# Patient Record
Sex: Female | Born: 1980
Health system: Southern US, Community
[De-identification: ages and names within clinical notes are randomized; demographics above are authoritative.]

## PROBLEM LIST (undated history)

## (undated) DIAGNOSIS — I82409 Acute embolism and thrombosis of unspecified deep veins of unspecified lower extremity: Secondary | ICD-10-CM

## (undated) DIAGNOSIS — J45909 Unspecified asthma, uncomplicated: Secondary | ICD-10-CM

## (undated) DIAGNOSIS — J302 Other seasonal allergic rhinitis: Secondary | ICD-10-CM

## (undated) DIAGNOSIS — E669 Obesity, unspecified: Secondary | ICD-10-CM

## (undated) HISTORY — DX: Other seasonal allergic rhinitis: J30.2

## (undated) HISTORY — PX: BARIATRIC SURGERY: SHX1103

## (undated) HISTORY — DX: Obesity, unspecified: E66.9

## (undated) HISTORY — DX: Unspecified asthma, uncomplicated: J45.909

## (undated) HISTORY — DX: Acute embolism and thrombosis of unspecified deep veins of unspecified lower extremity: I82.409

---

## 2019-08-21 ENCOUNTER — Ambulatory Visit
Admission: RE | Admit: 2019-08-21 | Discharge: 2019-08-21 | Disposition: A | Discharge status: Home / Self Care | Payer: BC Managed Care – PPO | Source: Ambulatory Visit | Attending: Emergency Medicine | Admitting: Emergency Medicine

## 2019-08-21 ENCOUNTER — Other Ambulatory Visit: Payer: Self-pay

## 2019-08-21 ENCOUNTER — Ambulatory Visit (INDEPENDENT_AMBULATORY_CARE_PROVIDER_SITE_OTHER): Payer: BC Managed Care – PPO

## 2019-08-21 VITALS — BP 123/89 | HR 71 | Temp 98.6°F | Resp 18 | Ht 62.0 in | Wt 192.0 lb

## 2019-08-21 DIAGNOSIS — S9032XA Contusion of left foot, initial encounter: Secondary | ICD-10-CM

## 2019-08-21 DIAGNOSIS — S60212A Contusion of left wrist, initial encounter: Secondary | ICD-10-CM | POA: Diagnosis not present

## 2019-08-21 DIAGNOSIS — W108XXA Fall (on) (from) other stairs and steps, initial encounter: Secondary | ICD-10-CM

## 2019-08-21 NOTE — ED Triage Notes (Signed)
Patient in today after falling on 08/19/19. Patient c/o neck pain, left thumb and wrist pain, left knee and left ankle pain. Patient states she was walking up the steps to her apartment and tripped and fell forward hitting her head on the wooden rail.  Patient is currently being treated for a blood clot behind her left knee and is on Xarelto.

## 2019-08-21 NOTE — ED Provider Notes (Signed)
[facility] - [place], [place]   Name: Lisa Gordon DOB: 03-14-1980 MRN: [id number] CSN: [id number] PCP: Patient, No Pcp Per  Arrival date and time:  08/21/19 1009  Chief Complaint:  Appointment, Fall (DOI 08/19/19), Neck Pain, Leg Pain, and Hand Pain   NOTE: Prior to seeing the patient today, I have reviewed the triage nursing documentation and vital signs. Clinical staff has updated patient's PMH/PSHx, current medication list, and drug allergies/intolerances to ensure comprehensive history available to assist in medical decision making.   History:   HPI: Lisa Gordon is a 39 y.o. female who presents today with complaints of pain to her left foot, knee and hand after a fall walking up the stairs 2 days ago.  Patient was moving from her apartment in flip-flops when she tripped going up the stairs.  She caught herself on her left side of her body, but she hit her head and knee.  No loss of consciousness with fall.  Since the fall, she has noticed some pain to her left foot, knee and hand.  She also has some residual pain to bilateral sides of her neck.  No headaches, abrasions, or any signs of blunt force trauma to her head noted.  She has been taking Tylenol for the pain, and occasionally icing the areas.  She is able to ambulate without assistance, but she does note some soreness to her foot with longer periods of ambulation and standing.  She is currently on Xarelto for blood clots to her posterior left leg.  Past Medical History:  Diagnosis Date  . Asthma    childhood  . DVT (deep venous thrombosis) (HCC)    left leg  . Obesity   . Seasonal allergies     Past Surgical History:  Procedure Laterality Date  . BARIATRIC SURGERY     vertical sleeve    Family History  Problem Relation Age of Onset  . Hypertension Mother   . Other Father        unknown medical history    Social History   Tobacco Use  . Smoking status: Never Smoker  . Smokeless tobacco: Never  Used  Vaping Use  . Vaping Use: Never used  Substance Use Topics  . Alcohol use: Yes    Comment: rare  . Drug use: Never    There are no problems to display for this patient.   Home Medications:    Current Meds  Medication Sig  . SAXENDA 18 MG/3ML SOPN Inject 3 mg into the skin daily.  Alveda Reasons 10 MG TABS tablet Take 10 mg by mouth daily.    Allergies:   Amoxicillin and Penicillins  Review of Systems (ROS): Review of Systems  Musculoskeletal: Positive for arthralgias, myalgias and neck pain. Negative for joint swelling.  Skin: Negative for color change and wound.  All other systems reviewed and are negative.    Vital Signs: Today's Vitals   08/21/19 1026 08/21/19 1027 08/21/19 1133  BP:  123/89   Pulse:  71   Resp:  18   Temp:  98.6 F (37 C)   TempSrc:  Oral   SpO2:  100%   Weight:  192 lb (87.1 kg)   Height:  5\' 2"  (1.575 m)   PainSc: 5   5     Physical Exam: Physical Exam Vitals and nursing note reviewed.  Cardiovascular:     Rate and Rhythm: Normal rate and regular rhythm.     Heart sounds: Normal heart sounds.  Pulmonary:     Breath sounds: Normal breath sounds.  Musculoskeletal:     Left wrist: No swelling, bony tenderness or snuff box tenderness. Normal range of motion.     Left knee: Ecchymosis present. No swelling or bony tenderness. Normal range of motion.     Left ankle: Normal. No tenderness. Normal range of motion.     Left foot: Tenderness and bony tenderness present.     Comments: Tenderness along first metatarsal.  No changes to range of motion.  Normal strength with flexion and extension of foot.  Skin:    General: Skin is warm and dry.     Findings: Bruising present.           Urgent Care Treatments / Results:   LABS: PLEASE NOTE: all labs that were ordered this encounter are listed, however only abnormal results are displayed. Labs Reviewed - No data to display  EKG: -None  RADIOLOGY: DG Wrist Complete Left  Result  Date: 08/21/2019 CLINICAL DATA:  Pain after fall EXAM: LEFT WRIST - COMPLETE 3+ VIEW COMPARISON:  None. FINDINGS: There is no evidence of fracture or dislocation. There is no evidence of arthropathy or other focal bone abnormality. Soft tissues are unremarkable. IMPRESSION: Negative. Electronically Signed   By: Gerome Sam III M.D   On: 08/21/2019 11:04   DG Foot Complete Left  Result Date: 08/21/2019 CLINICAL DATA:  Pain after fall EXAM: LEFT FOOT - COMPLETE 3+ VIEW COMPARISON:  None. FINDINGS: There is no evidence of fracture or dislocation. There is no evidence of arthropathy or other focal bone abnormality. Soft tissues are unremarkable. IMPRESSION: Negative. Electronically Signed   By: Gerome Sam III M.D   On: 08/21/2019 11:03    PROCEDURES: Procedures  MEDICATIONS RECEIVED THIS VISIT: Medications - No data to display  PERTINENT CLINICAL COURSE NOTES/UPDATES:   Initial Impression / Assessment and Plan / Urgent Care Course:  Pertinent labs & imaging results that were available during my care of the patient were personally reviewed by me and considered in my medical decision making (see lab/imaging section of note for values and interpretations).  Lisa Gordon is a 39 y.o. female who presents to [facility] today with complaints of pain to her extremities post fall, diagnosed with the same, and treated as such with the directions below. NP and patient reviewed discharge instructions below during visit.   Patient is well appearing overall in clinic today. She does not appear to be in any acute distress. Presenting symptoms (see HPI) and exam as documented above.   I have reviewed the follow up and strict return precautions for any new or worsening symptoms. Patient is aware of symptoms that would be deemed urgent/emergent, and would thus require further evaluation either here or in the emergency department. At the time of discharge, she verbalized understanding and consent  with the discharge plan as it was reviewed with her. All questions were fielded by provider and/or clinic staff prior to patient discharge.    Final Clinical Impressions / Urgent Care Diagnoses:   Final diagnoses:  Contusion of left foot, initial encounter  Contusion of left wrist, initial encounter  Fall (on) (from) other stairs and steps, initial encounter    New Prescriptions:  [place] Controlled Substance Registry consulted? Not Applicable  No orders of the defined types were placed in this encounter.     Discharge Instructions     You were seen for pain to your foot and wrist after a fall and are  being treated for the same.   No fractures were seen in your foot or wrist x-ray. Continue to treat your pain with Tylenol only.  You can use a wrist to ankle brace for extra compression and support if necessary. If pain does not get better, follow-up with your primary care provider to rule out soft tissue injury.  Take care, Dr. Sharlet Salina, NP-c     Recommended Follow up Care:  Patient encouraged to follow up with the following provider within the specified time frame, or sooner as dictated by the severity of her symptoms. As always, she was instructed that for any urgent/emergent care needs, she should seek care either here or in the emergency department for more immediate evaluation.   Lisa Mech, DNP, NP-c    Lisa Mech, NP 08/21/19 1157

## 2019-08-21 NOTE — Discharge Instructions (Signed)
You were seen for pain to your foot and wrist after a fall and are being treated for the same.   No fractures were seen in your foot or wrist x-ray. Continue to treat your pain with Tylenol only.  You can use a wrist to ankle brace for extra compression and support if necessary. If pain does not get better, follow-up with your primary care provider to rule out soft tissue injury.  Take care, Dr. Sharlet Salina, NP-c

## 2020-01-13 ENCOUNTER — Ambulatory Visit: Payer: BC Managed Care – PPO | Attending: Family

## 2020-01-13 DIAGNOSIS — Z23 Encounter for immunization: Secondary | ICD-10-CM

## 2020-05-01 NOTE — Progress Notes (Signed)
   Covid-19 Vaccination Clinic  Name:  Lisa Gordon    MRN: [id number] DOB: 1980/11/11  05/01/2020  [name] was observed post Covid-19 immunization for 15 minutes without incident. She was provided with Vaccine Information Sheet and instruction to access the V-Safe system.   [name] was instructed to call 911 with any severe reactions post vaccine: Marland Kitchen Difficulty breathing  . Swelling of face and throat  . A fast heartbeat  . A bad rash all over body  . Dizziness and weakness   Immunizations Administered    Name Date Dose VIS Date Route   Moderna Covid-19 Booster Vaccine 01/13/2020  1:30 PM 0.25 mL 12/14/2019 Intramuscular   Manufacturer: Moderna   Lot: [id number]   NDC: 44010-272-53

## 2021-03-06 ENCOUNTER — Ambulatory Visit
Admission: EM | Admit: 2021-03-06 | Discharge: 2021-03-06 | Disposition: A | Discharge status: Home / Self Care | Payer: BC Managed Care – PPO

## 2021-03-06 ENCOUNTER — Ambulatory Visit: Payer: Self-pay

## 2021-03-06 ENCOUNTER — Other Ambulatory Visit: Payer: Self-pay

## 2021-03-06 DIAGNOSIS — M62838 Other muscle spasm: Secondary | ICD-10-CM

## 2021-03-06 DIAGNOSIS — R0789 Other chest pain: Secondary | ICD-10-CM

## 2021-03-06 MED ORDER — CYCLOBENZAPRINE HCL 5 MG PO TABS: 5.0000 mg | ORAL_TABLET | Freq: Three times a day (TID) | ORAL | 0 refills | Status: AC | PRN

## 2021-03-06 NOTE — Discharge Instructions (Addendum)
May use your heating pad or ice, may take over the counter tylenol and muscle relaxer for pain/muscle spasm. Go to ER immediately or cal 9-1-1 if you develop chest pain,shortness of breath, nausea,vomiting, palpitations. Please get established with PCP of your choice. May try [facility] @ [phone] to help navigate PCP options.

## 2021-03-06 NOTE — ED Triage Notes (Addendum)
Pt reports left upper arm pain from elbow to her left shoulder, radiates to left upper chest and left posterior shoulder blade. Pt reports [place] is sharp and dull.  Nothing makes pain worse, heating pad and Tylenol has help reduce the pain/discomfort.   No known injury, has full ROM to left arm, however can trigger the pain.   Reports left upper CP (dull) 3/10 at current, hx of DVT to left leg (2015)  Reports 4 months postpartum

## 2021-03-06 NOTE — ED Provider Notes (Signed)
[facility]    CSN: [id number] Arrival date & time: 03/06/21  1750      History   Chief Complaint Chief Complaint  Patient presents with   Upper Left Arm Pain    HPI Lisa Gordon is a 41 y.o. female.   41 year old female patient, Lisa Gordon, presents to urgent care with chief complaint of left upper arm/muscle pain x 4 days. Pt states she has a small child at home and may have lifted him wrong. Pt states she has relief w heating pad and tylenol , pain is worse with movement, points to left lateral neck and trapezius area. Pt denies any chest pain, palpitations, shortness of breath, nausea,vomiting or diarrhea  The history is provided by the patient. No language interpreter was used.   Past Medical History:  Diagnosis Date   Asthma    childhood   DVT (deep venous thrombosis) (HCC)    left leg   Obesity    Seasonal allergies     Patient Active Problem List   Diagnosis Date Noted   Left-sided chest wall pain 03/06/2021   Trapezius muscle spasm 03/06/2021    Past Surgical History:  Procedure Laterality Date   BARIATRIC SURGERY     vertical sleeve    OB History   No obstetric history on file.      Home Medications    Prior to Admission medications   Medication Sig Start Date End Date Taking? Authorizing Provider  cyclobenzaprine (FLEXERIL) 5 MG tablet Take 1 tablet (5 mg total) by mouth 3 (three) times daily as needed for up to 5 days for muscle spasms. 03/06/21 03/11/21 Yes Robertta Halfhill, Para March, NP  Multiple Vitamin (MULTIVITAMIN) tablet Take 2 tablets by mouth daily.   Yes [provider]  XARELTO 10 MG TABS tablet Take 10 mg by mouth daily. 08/05/19  Yes [provider]  SAXENDA 18 MG/3ML SOPN Inject 3 mg into the skin daily. 06/24/19   [provider]    Family History Family History  Problem Relation Age of Onset   Hypertension Mother    Other Father        unknown medical history    Social History Social  History   Tobacco Use   Smoking status: Never   Smokeless tobacco: Never  Vaping Use   Vaping Use: Never used  Substance Use Topics   Alcohol use: Yes    Comment: rare   Drug use: Never     Allergies   Amoxicillin and Penicillins   Review of Systems Review of Systems  Constitutional:  Negative for fever.  Musculoskeletal:  Positive for myalgias and neck pain. Negative for arthralgias and gait problem.  Skin:  Negative for rash.  All other systems reviewed and are negative.   Physical Exam Triage Vital Signs ED Triage Vitals  Enc Vitals Group     BP      Pulse      Resp      Temp      Temp src      SpO2      Weight      Height      Head Circumference      Peak Flow      Pain Score      Pain Loc      Pain Edu?      Excl. in GC?    No data found.  Updated Vital Signs BP (!) 137/91 (BP Location: Right Arm)    Pulse  67    Temp 98.4 F (36.9 C) (Oral)    Resp 16    Ht 5\' 2"  (1.575 m)    Wt 216 lb (98 kg)    LMP 02/03/2021 (Approximate)    SpO2 98%    BMI 39.51 kg/m   Visual Acuity Right Eye Distance:   Left Eye Distance:   Bilateral Distance:    Right Eye Near:   Left Eye Near:    Bilateral Near:     Physical Exam Vitals and nursing note reviewed.  Constitutional:      General: She is not in acute distress.    Appearance: She is well-developed and well-groomed.  HENT:     Head: Normocephalic and atraumatic.  Eyes:     Conjunctiva/sclera: Conjunctivae normal.  Neck:      Comments: Area of pain marked, +TTP Cardiovascular:     Rate and Rhythm: Normal rate and regular rhythm.     Pulses: Normal pulses.     Heart sounds: Normal heart sounds. No murmur heard. Pulmonary:     Effort: Pulmonary effort is normal. No respiratory distress.     Breath sounds: Normal breath sounds and air entry.  Abdominal:     Palpations: Abdomen is soft.     Tenderness: There is no abdominal tenderness.  Musculoskeletal:        General: No swelling.     Cervical  back: Neck supple. Pain with movement and muscular tenderness present. No spinous process tenderness.  Skin:    General: Skin is warm and dry.     Capillary Refill: Capillary refill takes less than 2 seconds.     Findings: No rash.  Neurological:     General: No focal deficit present.     Mental Status: She is alert and oriented to person, place, and time.     GCS: GCS eye subscore is 4. GCS verbal subscore is 5. GCS motor subscore is 6.  Psychiatric:        Attention and Perception: Attention normal.        Mood and Affect: Mood normal.        Speech: Speech normal.        Behavior: Behavior normal. Behavior is cooperative.     UC Treatments / Results  Labs (all labs ordered are listed, but only abnormal results are displayed) Labs Reviewed - No data to display  EKG   Radiology No results found.  Procedures Procedures (including critical care time)  Medications Ordered in UC Medications - No data to display  Initial Impression / Assessment and Plan / UC Course  I have reviewed the triage vital signs and the nursing notes.  Pertinent labs & imaging results that were available during my care of the patient were reviewed by me and considered in my medical decision making (see chart for details).  Clinical Course as of 03/06/21 1912  Wed Mar 06, 2021  1815 EKG shows Sinus brady @ 59, no ectopy, no STEMI, reviewed and interpreted by myself. [JD]    Clinical Course User Index [JD] Namine Beahm, Mar 08, 2021, NP    Ddx: Muscle spasm, muscle strain, chest wall pain, anxiety, atypical chest pain Final Clinical Impressions(s) / UC Diagnoses   Final diagnoses:  Left-sided chest wall pain  Trapezius muscle spasm     Discharge Instructions      May use your heating pad or ice, may take over the counter tylenol and muscle relaxer for pain/muscle spasm. Go to ER immediately or cal 9-1-1  if you develop chest pain,shortness of breath, nausea,vomiting, palpitations. Please get  established with PCP of your choice. May try [facility] @ [phone] to help navigate PCP options.      ED Prescriptions     Medication Sig Dispense Auth. Provider   cyclobenzaprine (FLEXERIL) 5 MG tablet Take 1 tablet (5 mg total) by mouth 3 (three) times daily as needed for up to 5 days for muscle spasms. 15 tablet Therron Sells, Para MarchJeanette, NP      PDMP not reviewed this encounter.   Clancy Gourdefelice, Kenedee Molesky, NP 03/06/21 1912

## 2021-05-08 IMAGING — CR DG FOOT COMPLETE 3+V*L*
3 series · 3 of 3 positions shown · non-contrast
Comparison: None.

CLINICAL DATA: Pain after fall

EXAM:
LEFT FOOT - COMPLETE 3+ VIEW

[foot ap]
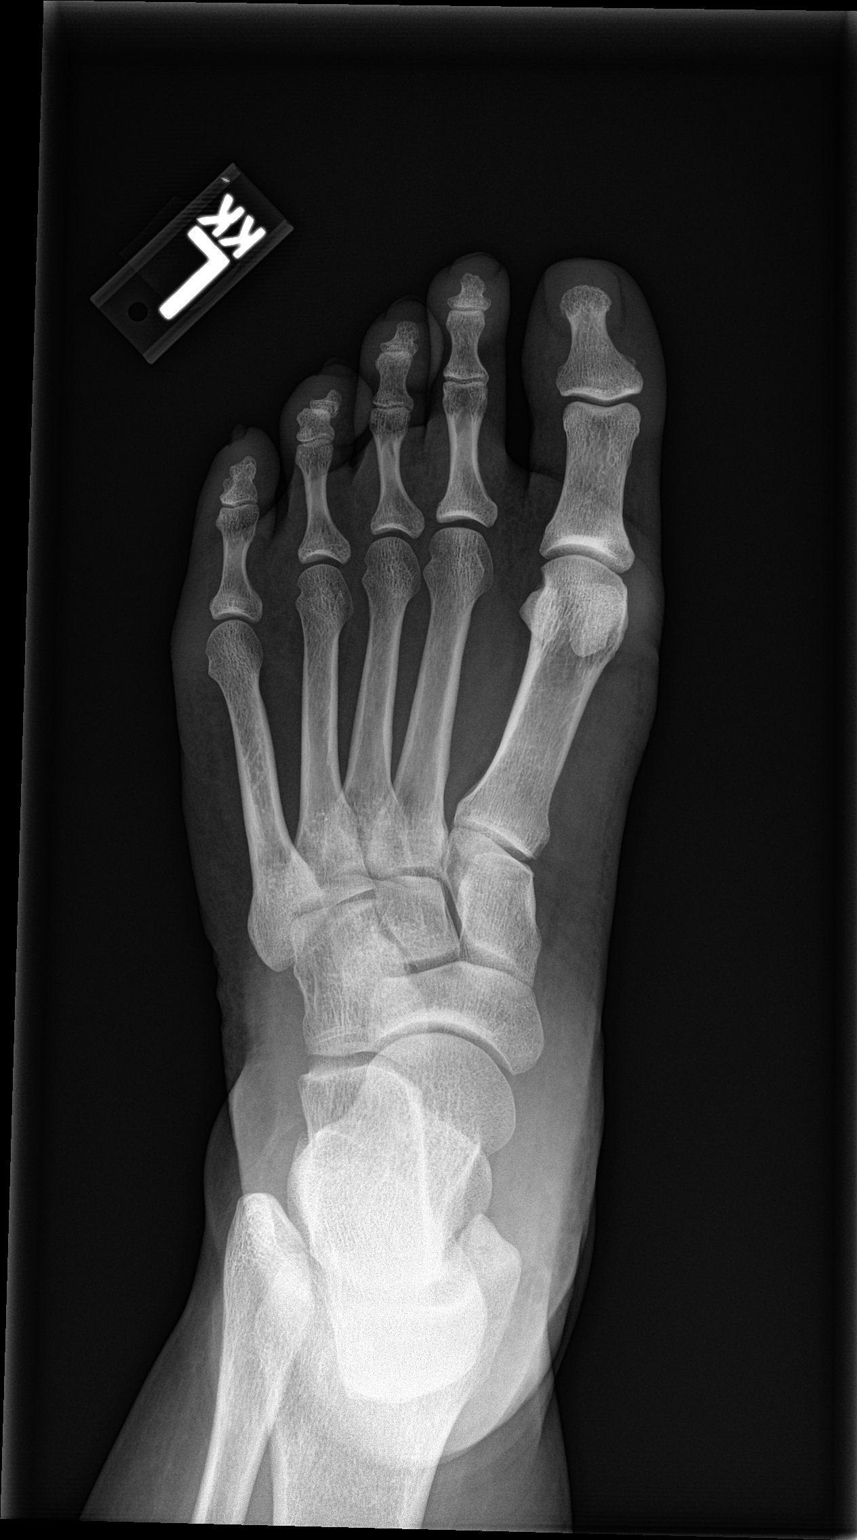

[foot obl]
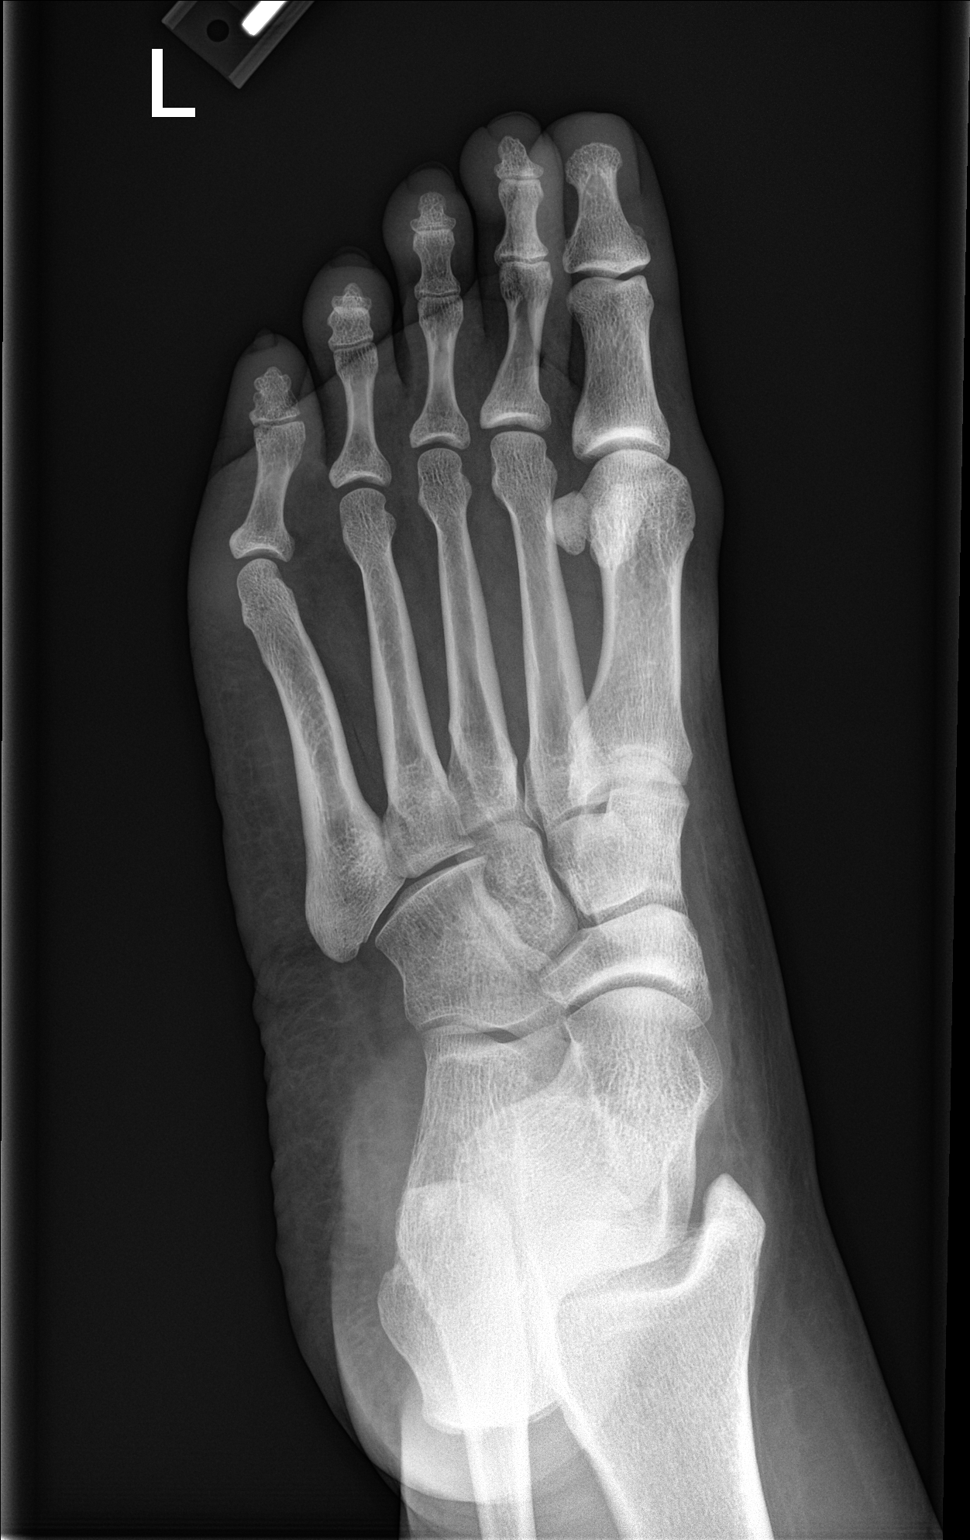

[foot lat]
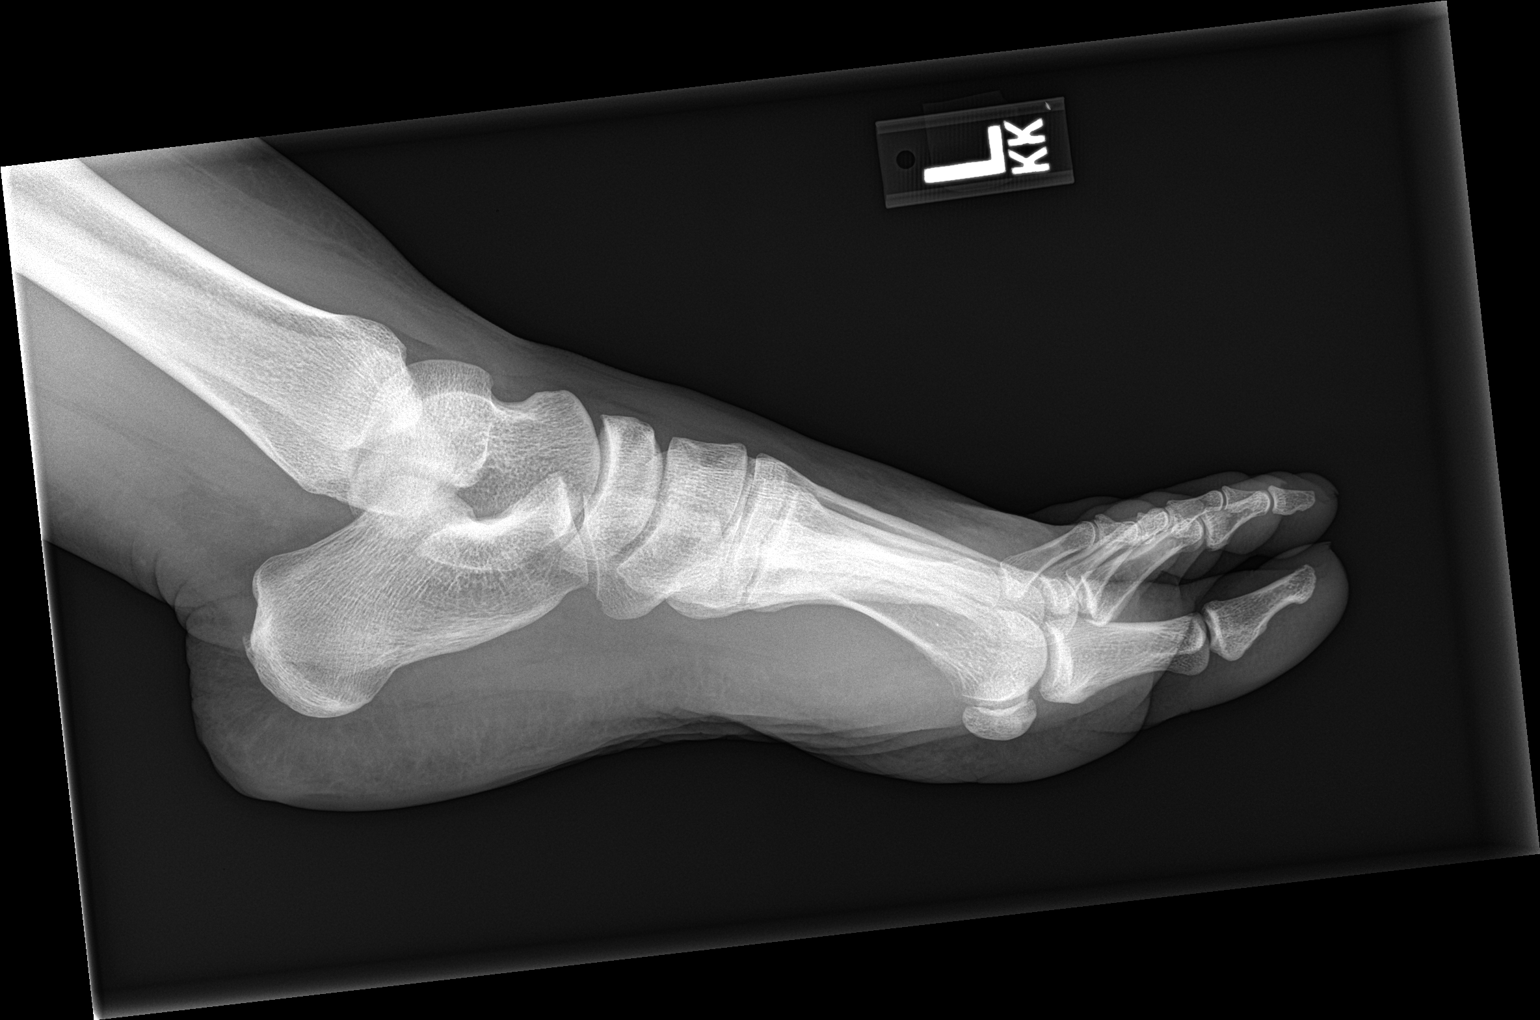

[3 of 3 positions shown; findings below may reference images not displayed]

FINDINGS: There is no evidence of fracture or dislocation. There is no
evidence of arthropathy or other focal bone abnormality. Soft
tissues are unremarkable.
IMPRESSION: Negative.
